# Patient Record
Sex: Male | Born: 1960 | Race: White | Hispanic: No | Marital: Married | Smoking: Former smoker
Health system: Southern US, Community
[De-identification: ages and names within clinical notes are randomized; demographics above are authoritative.]

## PROBLEM LIST (undated history)

## (undated) DIAGNOSIS — E119 Type 2 diabetes mellitus without complications: Secondary | ICD-10-CM

## (undated) DIAGNOSIS — I219 Acute myocardial infarction, unspecified: Secondary | ICD-10-CM

## (undated) DIAGNOSIS — U071 COVID-19: Secondary | ICD-10-CM

## (undated) DIAGNOSIS — I2510 Atherosclerotic heart disease of native coronary artery without angina pectoris: Secondary | ICD-10-CM

## (undated) HISTORY — DX: Atherosclerotic heart disease of native coronary artery without angina pectoris: I25.10

## (undated) HISTORY — DX: COVID-19: U07.1

## (undated) HISTORY — DX: Acute myocardial infarction, unspecified: I21.9

## (undated) HISTORY — DX: Type 2 diabetes mellitus without complications: E11.9

## (undated) HISTORY — PX: CORONARY STENT PLACEMENT: SHX1402

---

## 2010-07-09 DEATH — deceased

## 2020-09-26 ENCOUNTER — Other Ambulatory Visit: Payer: Self-pay

## 2020-09-26 ENCOUNTER — Ambulatory Visit (INDEPENDENT_AMBULATORY_CARE_PROVIDER_SITE_OTHER): Payer: 59 | Admitting: Pulmonary Disease

## 2020-09-26 ENCOUNTER — Encounter: Payer: Self-pay | Admitting: Pulmonary Disease

## 2020-09-26 VITALS — BP 128/70 | HR 59 | Ht 73.0 in | Wt 313.2 lb

## 2020-09-26 DIAGNOSIS — Z6841 Body Mass Index (BMI) 40.0 and over, adult: Secondary | ICD-10-CM

## 2020-09-26 DIAGNOSIS — R0602 Shortness of breath: Secondary | ICD-10-CM

## 2020-09-26 DIAGNOSIS — R0600 Dyspnea, unspecified: Secondary | ICD-10-CM

## 2020-09-26 DIAGNOSIS — Z8616 Personal history of COVID-19: Secondary | ICD-10-CM

## 2020-09-26 DIAGNOSIS — R0683 Snoring: Secondary | ICD-10-CM

## 2020-09-26 DIAGNOSIS — J849 Interstitial pulmonary disease, unspecified: Secondary | ICD-10-CM

## 2020-09-26 DIAGNOSIS — Z87891 Personal history of nicotine dependence: Secondary | ICD-10-CM | POA: Diagnosis not present

## 2020-09-26 DIAGNOSIS — R0609 Other forms of dyspnea: Secondary | ICD-10-CM

## 2020-09-26 MED ORDER — TRELEGY ELLIPTA 100-62.5-25 MCG/INH IN AEPB: 1.0000 | INHALATION_SPRAY | Freq: Every day | RESPIRATORY_TRACT | 0 refills | Status: AC

## 2020-09-26 NOTE — Progress Notes (Signed)
Patient seen in the office today and instructed on use of Trelegy 100.  Patient expressed understanding and demonstrated technique.  Joe Hicks [facility] 09/26/20

## 2020-09-26 NOTE — Progress Notes (Signed)
Synopsis: Referred in sept 2022 for DOE by No ref. provider found  Subjective:   PATIENT ID: Joe Hicks GENDER: male DOB: [birth year], MRN: [id number]  Chief Complaint  Patient presents with   Consult    Self referral for COPD. States he had COVID PNA back in February 2022 and has had issues with SOB ever since. PCP mentioned that he may have COPD.     PMH cad, stent placement X4, history of covid19 feb/march 2022, he was taken to [facility] for management of his [phone]. He was [place] and was sent to [facility]. Was never placed on vent support.  Patient is a former smoker, at his heaviest smoking 2 packs/day.  He is a truck driver/repair/mechanic occupation history.  Mother had pulmonary fibrosis which was felt to potentially be related to asbestos exposure.  Father died in a motor vehicle accident at age 59.   Past Medical History:  Diagnosis Date   CAD (coronary artery disease)    COVID-19    DMII (diabetes mellitus, type 2) (HCC)    MI (myocardial infarction) (HCC)      Family History  Problem Relation Age of Onset   Pulmonary fibrosis Mother      Past Surgical History:  Procedure Laterality Date   CORONARY STENT PLACEMENT      Social History   Socioeconomic History   Marital status: Married    Spouse name: Not on file   Number of children: Not on file   Years of education: Not on file   Highest education level: Not on file  Occupational History   Not on file  Tobacco Use   Smoking status: Former    Packs/day: 2.00    Types: Cigarettes    Start date: 01/08/1981    Quit date: 03/11/2000    Years since quitting: 20.5   Smokeless tobacco: Never  Substance and Sexual Activity   Alcohol use: Not on file   Drug use: Not on file   Sexual activity: Not on file  Other Topics Concern   Not on file  Social History Narrative   Not on file   Social Determinants of Health   Financial Resource Strain: Not on file  Food Insecurity: Not on file   Transportation Needs: Not on file  Physical Activity: Not on file  Stress: Not on file  Social Connections: Not on file  Intimate Partner Violence: Not on file     No Known Allergies   Outpatient Medications Prior to Visit  Medication Sig Dispense Refill   aspirin EC 81 MG tablet Take 81 mg by mouth daily.     calcium-vitamin D (OSCAL WITH D) 500-200 MG-UNIT TABS tablet Take by mouth.     metFORMIN (GLUCOPHAGE-XR) 500 MG 24 hr tablet Take 500 mg by mouth daily.     pravastatin (PRAVACHOL) 40 MG tablet SMARTSIG:1 Tablet(s) By Mouth Every Evening     traZODone (DESYREL) 50 MG tablet Take by mouth as needed.     vitamin B-12 (CYANOCOBALAMIN) 1000 MCG tablet Take 1,000 mcg by mouth daily.     No facility-administered medications prior to visit.    Review of Systems  Constitutional:  Positive for malaise/fatigue. Negative for chills, fever and weight loss.  HENT:  Negative for hearing loss, sore throat and tinnitus.   Eyes:  Negative for blurred vision and double vision.  Respiratory:  Positive for shortness of breath. Negative for cough, hemoptysis, sputum production, wheezing and stridor.   Cardiovascular:  Negative for chest pain, palpitations, orthopnea, leg swelling and PND.  Gastrointestinal:  Negative for abdominal pain, constipation, diarrhea, heartburn, nausea and vomiting.  Genitourinary:  Negative for dysuria, hematuria and urgency.  Musculoskeletal:  Negative for joint pain and myalgias.  Skin:  Negative for itching and rash.  Neurological:  Negative for dizziness, tingling, weakness and headaches.  Endo/Heme/Allergies:  Negative for environmental allergies. Does not bruise/bleed easily.  Psychiatric/Behavioral:  Negative for depression. The patient is not nervous/anxious and does not have insomnia.   All other systems reviewed and are negative.   Objective:  Physical Exam Vitals reviewed.  Constitutional:      General: He is not in acute distress.    Appearance: He  is well-developed. He is obese.  HENT:     Head: Normocephalic and atraumatic.  Eyes:     General: No scleral icterus.    Conjunctiva/sclera: Conjunctivae normal.     Pupils: Pupils are equal, round, and reactive to light.  Neck:     Vascular: No JVD.     Trachea: No tracheal deviation.  Cardiovascular:     Rate and Rhythm: Normal rate and regular rhythm.     Heart sounds: Normal heart sounds. No murmur heard. Pulmonary:     Effort: Pulmonary effort is normal. No tachypnea, accessory muscle usage or respiratory distress.     Breath sounds: No stridor. No wheezing, rhonchi or rales.  Abdominal:     General: Bowel sounds are normal. There is no distension.     Palpations: Abdomen is soft.     Tenderness: There is no abdominal tenderness.  Musculoskeletal:        General: No tenderness.     Cervical back: Neck supple.  Lymphadenopathy:     Cervical: No cervical adenopathy.  Skin:    General: Skin is warm and dry.     Capillary Refill: Capillary refill takes less than 2 seconds.     Findings: No rash.  Neurological:     Mental Status: He is alert and oriented to person, place, and time.  Psychiatric:        Behavior: Behavior normal.     Vitals:   09/26/20 1451  BP: 128/70  Pulse: (!) 59  SpO2: 96%  Weight: (!) 313 lb 3.2 oz (142.1 kg)  Height: 6\' 1"  (1.854 m)   96% on RA BMI Readings from Last 3 Encounters:  09/26/20 41.32 kg/m   Wt Readings from Last 3 Encounters:  09/26/20 (!) 313 lb 3.2 oz (142.1 kg)     CBC No results found for: WBC, RBC, HGB, HCT, PLT, MCV, MCH, MCHC, RDW, LYMPHSABS, MONOABS, EOSABS, BASOSABS    Chest Imaging: No recent CT imaging  Pulmonary Functions Testing Results: No flowsheet data found.  FeNO:   Pathology:   Echocardiogram:  Heart Catheterization:     Assessment & Plan:     ICD-10-CM   1. Interstitial pulmonary disease (HCC)  J84.9 CT CHEST HIGH RESOLUTION    Pulmonary Function Test    2. SOB (shortness of breath)   R06.02 Pulmonary Function Test    3. DOE (dyspnea on exertion)  R06.00 Pulmonary Function Test    4. Former smoker  Z87.891 Pulmonary Function Test    5. History of COVID-19  Z86.16     6. BMI 40.0-44.9, adult [facility])  Z68.41       Discussion:  This is a 60 year old gentleman, history of COVID-19 in February/March 2022.  Developed significant shortness of breath posthospitalization.  Was never  intubated or placed on mechanical life support however did have severe COVID.  He is a former smoker quit many years ago but at his max with smoking 2 packs/day.  Referred for evaluation of his dyspnea on exertion and shortness of breath.  He was discharged on home oxygen therapy and no longer needing this.  He has not had any recent axial CT imaging.  Plan: We will start by obtaining an HRCT of the chest Was help rule out any parenchymal changes related to his history of COVID. We will obtain full pulmonary function test prior to next office visit. Start him on Trelegy samples to see if this helps symptomatically. New prescription for Trelegy Can continue albuterol for shortness of breath and wheezing.  I will order home sleep study for the patient to see Dr. Val Eagle, sleep clinic follow-up.    Current Outpatient Medications:    aspirin EC 81 MG tablet, Take 81 mg by mouth daily., Disp: , Rfl:    calcium-vitamin D (OSCAL WITH D) 500-200 MG-UNIT TABS tablet, Take by mouth., Disp: , Rfl:    metFORMIN (GLUCOPHAGE-XR) 500 MG 24 hr tablet, Take 500 mg by mouth daily., Disp: , Rfl:    pravastatin (PRAVACHOL) 40 MG tablet, SMARTSIG:1 Tablet(s) By Mouth Every Evening, Disp: , Rfl:    traZODone (DESYREL) 50 MG tablet, Take by mouth as needed., Disp: , Rfl:    vitamin B-12 (CYANOCOBALAMIN) 1000 MCG tablet, Take 1,000 mcg by mouth daily., Disp: , Rfl:     Josephine Igo, DO [facility] 09/26/2020 3:35 PM

## 2020-09-26 NOTE — Addendum Note (Signed)
Addended by: Maurene Capes on: 09/26/2020 04:32 PM   Modules accepted: Orders

## 2020-09-26 NOTE — Patient Instructions (Addendum)
Thank you for visiting Dr. Tonia Brooms at [facility]. Today we recommend the following:  Full PFTs prior to next office visit.  HRCT prior to next office visit  Trelegy samples + prescription   Please order HST with Dr. Wynona Neat, follow up with him after HST complete.   Return in about 4 weeks (around 10/24/2020) for with APP or Dr. Tonia Brooms.    Please do your part to reduce the spread of COVID-19.

## 2020-10-25 ENCOUNTER — Inpatient Hospital Stay: Admission: RE | Admit: 2020-10-25 | Payer: 59 | Source: Ambulatory Visit

## 2020-10-31 ENCOUNTER — Ambulatory Visit: Payer: 59 | Admitting: Pulmonary Disease

## 2020-11-03 ENCOUNTER — Inpatient Hospital Stay: Admission: RE | Admit: 2020-11-03 | Payer: 59 | Source: Ambulatory Visit

## 2020-11-21 ENCOUNTER — Ambulatory Visit (INDEPENDENT_AMBULATORY_CARE_PROVIDER_SITE_OTHER)
Admission: RE | Admit: 2020-11-21 | Discharge: 2020-11-21 | Disposition: A | Discharge status: Home / Self Care | Payer: 59 | Source: Ambulatory Visit | Attending: Pulmonary Disease | Admitting: Pulmonary Disease

## 2020-11-21 ENCOUNTER — Other Ambulatory Visit: Payer: Self-pay

## 2020-11-21 DIAGNOSIS — J849 Interstitial pulmonary disease, unspecified: Secondary | ICD-10-CM

## 2020-11-28 ENCOUNTER — Ambulatory Visit: Payer: 59 | Admitting: Pulmonary Disease

## 2020-12-03 NOTE — Progress Notes (Signed)
The patient was not present for his last scheduled appt. He however has significant findings on his HRCT concerning for fibrosis. Please contact patient and let him know that I would like for him to follow up with Dr. Marchelle Gearing to review results and discuss next steps. If we are unable to reach please send certified letter that our office has tried to reach him.   Next available new consult ILD appt with MR would be appropriate.   Thanks,  BLI  Josephine Igo, DO [facility] 12/03/2020 6:22 PM

## 2020-12-07 ENCOUNTER — Telehealth: Payer: Self-pay | Admitting: Internal Medicine

## 2020-12-07 NOTE — Telephone Encounter (Signed)
LMTCB

## 2020-12-07 NOTE — Telephone Encounter (Signed)
Called patient but he did not answer. Left message for him to call back tomorrow.

## 2020-12-27 NOTE — Telephone Encounter (Signed)
Pt has appt scheduled with MR 01/12/21. Closing encounter.

## 2021-01-06 ENCOUNTER — Ambulatory Visit: Payer: 59 | Admitting: Pulmonary Disease

## 2021-01-12 ENCOUNTER — Ambulatory Visit (INDEPENDENT_AMBULATORY_CARE_PROVIDER_SITE_OTHER): Payer: 59 | Admitting: Internal Medicine

## 2021-01-12 ENCOUNTER — Encounter: Payer: Self-pay | Admitting: Internal Medicine

## 2021-01-12 ENCOUNTER — Other Ambulatory Visit: Payer: Self-pay

## 2021-01-12 VITALS — BP 134/86 | HR 66 | Temp 97.9°F | Ht 72.0 in | Wt 312.4 lb

## 2021-01-12 DIAGNOSIS — R0609 Other forms of dyspnea: Secondary | ICD-10-CM | POA: Diagnosis not present

## 2021-01-12 DIAGNOSIS — Z8616 Personal history of COVID-19: Secondary | ICD-10-CM | POA: Diagnosis not present

## 2021-01-12 DIAGNOSIS — R0683 Snoring: Secondary | ICD-10-CM

## 2021-01-12 DIAGNOSIS — J849 Interstitial pulmonary disease, unspecified: Secondary | ICD-10-CM | POA: Diagnosis not present

## 2021-01-12 DIAGNOSIS — Z87891 Personal history of nicotine dependence: Secondary | ICD-10-CM

## 2021-01-12 DIAGNOSIS — I2510 Atherosclerotic heart disease of native coronary artery without angina pectoris: Secondary | ICD-10-CM

## 2021-01-12 DIAGNOSIS — R911 Solitary pulmonary nodule: Secondary | ICD-10-CM

## 2021-01-12 DIAGNOSIS — Z6841 Body Mass Index (BMI) 40.0 and over, adult: Secondary | ICD-10-CM | POA: Diagnosis not present

## 2021-01-12 LAB — SEDIMENTATION RATE: Sed Rate: 34 mm/hr — ABNORMAL HIGH (ref 0–20)

## 2021-01-12 NOTE — Patient Instructions (Addendum)
ICD-10-CM   1. Interstitial pulmonary disease (HCC)  J84.9 Sedimentation rate    Angiotensin converting enzyme    ANA    Aldolase    Anti-DNA antibody, double-stranded    Rheumatoid factor    Cyclic citrul peptide antibody, IgG    CK total and CKMB (cardiac)not at [facility]    Sjogren's syndrome antibods(ssa + ssb)    Hypersensitivity Pneumonitis    Anti-Jo 1 antibody, IgG    RNP Antibodies    Anti-scleroderma antibody    Pulmonary function test    ECHOCARDIOGRAM COMPLETE    AMB referral to pulmonary rehabilitation    Anti-scleroderma antibody    RNP Antibodies    Anti-Jo 1 antibody, IgG    Hypersensitivity Pneumonitis    Sjogren's syndrome antibods(ssa + ssb)    CK total and CKMB (cardiac)not at [facility]    Cyclic citrul peptide antibody, IgG    Rheumatoid factor    Anti-DNA antibody, double-stranded    Aldolase    ANA    Angiotensin converting enzyme    Sedimentation rate    2. History of COVID-19  Z86.16     3. DOE (dyspnea on exertion)  R06.09     4. BMI 40.0-44.9, adult ([place])  Z68.41     5. Snoring  R06.83     6. Former smoker  Z87.891     10. Coronary artery disease involving native heart without angina pectoris, unspecified vessel or lesion type  I25.10     8. Nodule of upper lobe of left lung  R91.1       Plan  - Do blood work ESR, ACE, ANA, DS-DNA, RF, anti-CCP, , Total CK,  Aldolase,  scl-70, ssA, ssB, anti-RNP, anti-JO-1,  & Hypersensitivity Pneumonitis Panel - do full PFT  - do ILD questionnaire - do echo cardiogram  - refer pulmonary rehab (but get clearance from yoru cardiologist it is safe)  - refer sleep doc in our office  Followup  - next few weeks to few months to review results  = will need repeeat CT chest for nodule in May 2023

## 2021-01-12 NOTE — Progress Notes (Signed)
Chief Complaint  Patient presents with   Consult      Self referral for COPD. States he had COVID PNA back in February 2022 and has had issues with SOB ever since. PCP mentioned that he may have COPD.       PMH cad, stent placement X4, history of covid19 feb/march 2022, he was taken to [facility] for management of his [phone]. He was [place] and was sent to [facility]. Was never placed on vent support.  Patient is a former smoker, at his heaviest smoking 2 packs/day.  He is a truck driver/repair/mechanic occupation history.  Mother had pulmonary fibrosis which was felt to potentially be related to asbestos exposure.  Father died in a motor vehicle accident at age 23    OV 01/12/2021 referral by Dr. June Leap to the ILD center -  Dr. Chase Caller  Subjective:  Patient ID: Joe Hicks, male , DOB: 14-Jul-1960 , age 61 y.o. , MRN: [id number] , ADDRESS: [place] [address] PCP Driggers, Hewitt Shorts, PA-C Patient Care Team: Driggers, Sharen Hint as PCP - General (Physician Assistant)  This Provider for this visit: Treatment Team:  Attending Provider: Brand Males, MD    01/12/2021 -   Chief Complaint  Patient presents with   Follow-up    Pt had HRCT performed and is here due to that. Pt states that he does become SOB with ambulation and if does various activities.     HPI Joe Hicks 61 y.o. -presents with his wife.  History is given by him and review of the chart.  He has morbid obesity weighing 321 pounds in the past.  He says prior to him getting COVID-19 in February 2022 he had lost weight to 275 pounds but then after getting COVID he has now gained weight to 312 pounds.  The COVID itself was severe.  He says he was hospitalized for up to 2 weeks between [place] and [facility].  He was on high flow oxygen.  They were looking at intubating him but he refused.  He believes he survived because he refused intubation.  He is  currently off oxygen.  He saw Dr. June Leap.  After this a CT scan of the chest was done November 2022.  Was a high-resolution CT chest that I personally visualized.  Radiologist feels description fits most with post-COVID setting with more upper lobe predominance.  The dyspnea is definitely present only after the COVID.  After the COVID is also gained his old weight back or at least almost.  No chest pains.  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  So has not had a sleep evaluation.  Wife does note that he has had apneic spells some years ago but does note heavily even currently.    SYMPTOM SCALE - ILD 01/12/2021  Current weight   O2 use ra  Shortness of Breath 0 -> 5 scale with 5 being worst (score 6 If unable to do)  At rest 0  Simple tasks - showers, clothes change, eating, shaving 0  Household (dishes, doing bed, laundry) 0  Shopping 3  Walking level at own pace 4  Walking  up Stairs 5  Total (30-36) Dyspnea Score 12  How bad is your cough? 2  How bad is your fatigue 0  How bad is nausea 0  How bad is vomiting?  0  How bad is diarrhea? 0  How bad is anxiety? 3  How bad is depression 0  Any chronic pain - if so where and how bad 0   Simple office walk 185 feet x  3 laps goal with forehead probe 01/12/2021    O2 used 3   Number laps completed avg   Comments about pace ra   Resting Pulse Ox/HR 100% and 66/min   Final Pulse Ox/HR 97% and 84/min   Desaturated </= 88% no   Desaturated <= 3% points yes   Got Tachycardic >/= 90/min no   Symptoms at end of test Mild-mod dyspnea   Miscellaneous comments x       CT Chest data HRCT 11/21/20  IMPRESSION: 1. Fibrotic interstitial lung disease which is most pronounced in the mid and upper  lungs. Favor post COVID fibrosis. Findings are suggestive of an alternative diagnosis (not UIP) per consensus guidelines: Diagnosis of Idiopathic Pulmonary Fibrosis: An Official ATS/ERS/JRS/ALAT Clinical Practice Guideline. [place], Iss 5, [phone], Sep 08 2016. 2. Solid pulmonary nodul*6*6 7 [facility] s (from today's scan) is considered optional for low-risk patients, but is recommended for high-risk patients. This recommendation follows the consensus statement: Guidelines for Management of Incidental Pulmonary Nodules Detected on CT Images: From the Fleischner Society 2017; Radiology 2017; 284:228-243. 3. Aortic Atherosclerosis (ICD10-I70.0). 4. Three-vessel coronary artery calcifications, recommend ASCVD risk assessment.     Electronically Signed   By: Yetta Glassman M.D.   On: 11/21/2020 12:41    No results found.    PFT  No flowsheet data found.     has a past medical history of CAD (coronary artery disease), COVID-19, DMII (diabetes mellitus, type 2) ([place]), and MI (myocardial infarction) ([place]).   reports that he quit smoking about 20 years ago. His smoking use included cigarettes. He started smoking about 40 years ago. He smoked an average of 2 packs per day. He has never used smokeless tobacco.  Past Surgical History:  Procedure Laterality Date   CORONARY STENT PLACEMENT      No Known Allergies  Immunization History  Administered Date(s) Administered   Influenza,inj,Quad PF,6+ Mos 03/05/2020   Pneumococcal Polysaccharide-23 03/05/2020    Family History  Problem Relation Age of Onset   Pulmonary fibrosis Mother      Current Outpatient Medications:    aspirin EC 81 MG tablet, Take 81 mg by mouth daily., Disp: , Rfl:    calcium-vitamin D (OSCAL WITH D) 500-200 MG-UNIT TABS tablet, Take by mouth., Disp: , Rfl:    Fluticasone-Umeclidin-Vilant (TRELEGY ELLIPTA) 100-62.5-25 MCG/INH AEPB, Inhale 1 puff into the lungs daily., Disp: 1 each,  Rfl: 0   metFORMIN (GLUCOPHAGE-XR) 500 MG 24 hr tablet, Take 500 mg by mouth daily., Disp: , Rfl:    pravastatin (PRAVACHOL) 40 MG tablet, SMARTSIG:1 Tablet(s) By Mouth Every Evening, Disp: , Rfl:    traZODone (DESYREL) 50 MG tablet, Take by mouth as needed., Disp: , Rfl:    vitamin B-12 (CYANOCOBALAMIN) 1000 MCG tablet, Take 1,000 mcg by mouth daily., Disp: , Rfl:       Objective:   Vitals:   01/12/21 0814  BP: 134/86  Pulse: 66  Temp: 97.9 F (36.6 C)  TempSrc: Oral  SpO2: 100%  Weight: Marland Kitchen)  312 lb 6.4 oz (141.7 kg)  Height: 6' (1.829 m)    Estimated body mass index is 42.37 kg/m as calculated from the following:   Height as of this encounter: 6' (1.829 m).   Weight as of this encounter: 312 lb 6.4 oz (141.7 kg).  @WEIGHTCHANGE @  [employer]   01/12/21 0814  Weight: (!) 312 lb 6.4 oz (141.7 kg)     Physical Exam  General: No distress. obese Neuro: Alert and Oriented x 3. GCS 15. Speech normal Psych: Pleasant Resp:  Barrel Chest - no.  Wheeze - no, Crackles - no, No overt respiratory distress CVS: Normal heart sounds. Murmurs - no Ext: Stigmata of Connective Tissue Disease - no HEENT: Normal upper airway. PEERL +. No post nasal drip. MALLAMPATTI CLAS 3        Assessment:       ICD-10-CM   1. Interstitial pulmonary disease ([place])  J84.9     2. History of COVID-19  Z86.16     3. DOE (dyspnea on exertion)  R06.09     4. BMI 40.0-44.9, adult ([place])  Z68.41     5. Snoring  R06.83     6. Former smoker  Z87.891     10. Coronary artery disease involving native heart without angina pectoris, unspecified vessel or lesion type  I25.10          Plan:     Patient Instructions     ICD-10-CM   1. Interstitial pulmonary disease ([place])  J84.9     2. History of COVID-19  Z86.16     3. DOE (dyspnea on exertion)  R06.09     4. BMI 40.0-44.9, adult ([place])  Z68.41     5. Snoring  R06.83     6. Former smoker  Z87.891     29. Coronary artery disease involving  native heart without angina pectoris, unspecified vessel or lesion type  I25.10       Plan  - Do blood work ESR, ACE, ANA, DS-DNA, RF, anti-CCP, , Total CK,  Aldolase,  scl-70, ssA, ssB, anti-RNP, anti-JO-1,  & Hypersensitivity Pneumonitis Panel - do full PFT  - do ILD questionnaire - do echo cardiogram  - refer pulmonary rehab (but get clearance from yoru cardiologist it is safe)  - refer sleep doc in our office  Followup  - next few weeks to few months to review results   ( Level 05 visit: Estb 40-54 min   visit type: on-site physical face to visit  in total care time and counseling or/and coordination of care by this undersigned MD - Dr Brand Males. This includes one or more of the following on this same day 01/12/2021: pre-charting, chart review, note writing, documentation discussion of test results, diagnostic or treatment recommendations, prognosis, risks and benefits of management options, instructions, education, compliance or risk-factor reduction. It excludes time spent by the [place] or office staff in the care of the patient. Actual time 50 min)    SIGNATURE    Dr. Brand Males, M.D., F.C.C.P,  Pulmonary and Critical Care Medicine Staff Physician, [place] Director - Interstitial Lung Disease  Program  Pulmonary [place] at [place], [place], [address]  Pager: [phone], If no answer or between  15:00h - 7:00h: call [phone] Telephone: [phone]  9:11 AM 01/12/2021

## 2021-01-16 LAB — HYPERSENSITIVITY PNEUMONITIS
A. Pullulans Abs: NEGATIVE
A.Fumigatus #1 Abs: NEGATIVE
Micropolyspora faeni, IgG: NEGATIVE
Pigeon Serum Abs: NEGATIVE
Thermoact. Saccharii: NEGATIVE
Thermoactinomyces vulgaris, IgG: NEGATIVE

## 2021-01-16 LAB — ALDOLASE: Aldolase: 5 U/L (ref <=8.1)

## 2021-01-16 LAB — ANTI-JO 1 ANTIBODY, IGG: Anti JO-1: <0.2 AI (ref 0.0–0.9)

## 2021-01-16 LAB — RNP ANTIBODIES: ENA RNP Ab: <0.2 AI (ref 0.0–0.9)

## 2021-01-17 ENCOUNTER — Telehealth (HOSPITAL_COMMUNITY): Payer: Self-pay | Admitting: *Deleted

## 2021-01-17 ENCOUNTER — Telehealth (HOSPITAL_COMMUNITY): Payer: Self-pay | Admitting: Physician Assistant

## 2021-01-17 LAB — SJOGREN'S SYNDROME ANTIBODS(SSA + SSB)
SSA (Ro) (ENA) Antibody, IgG: <1 AI
SSB (La) (ENA) Antibody, IgG: <1 AI

## 2021-01-17 LAB — ANA: Anti Nuclear Antibody (ANA): NEGATIVE

## 2021-01-17 LAB — ANGIOTENSIN CONVERTING ENZYME: Angiotensin-Converting Enzyme: 41 U/L (ref 9–67)

## 2021-01-17 LAB — CK TOTAL AND CKMB (NOT AT ARMC): Total CK: 93 U/L (ref 44–196)

## 2021-01-17 LAB — CYCLIC CITRUL PEPTIDE ANTIBODY, IGG: Cyclic Citrullin Peptide Ab: <16 UNITS

## 2021-01-17 LAB — ANTI-DNA ANTIBODY, DOUBLE-STRANDED: ds DNA Ab: 1 IU/mL

## 2021-01-17 LAB — RHEUMATOID FACTOR: Rheumatoid fact SerPl-aCnc: <14 IU/mL (ref <14)

## 2021-01-17 LAB — ANTI-SCLERODERMA ANTIBODY: Scleroderma (Scl-70) (ENA) Antibody, IgG: <1 AI

## 2021-01-17 NOTE — Telephone Encounter (Signed)
Returned call from message left earlier by pt wife  - Joe Hicks who is listed as DPR.  Pt received a text message regarding scheduling for pulmonary rehab. At first he thought it was about his heart as Dr. Marchelle Gearing mentioned he would like to get an echocardiogram. Explained  who we were and that the text messages are generated by a third party vendor and do not accurately indicate ready for scheduling.  Advised we ar reviewing the referral clinically and for insurance coverage.  Once completed he will be added to the wait list and he will be contacted to review benefits and scheduling at later time. Noted that pt lives in [place] which according to the pt is about 50 miles from [place].  Unfortunately programs closer to his home are not covered by his insurance therefore he would like to come to [facility]. Reviewed upcoming appt with PFT on 2/23 and follow up by [facility]. Thanked me for the information and for the return call. Alanson Aly, BSN Cardiac and Emergency planning/management officer

## 2021-01-23 ENCOUNTER — Encounter (HOSPITAL_COMMUNITY): Payer: Self-pay | Admitting: *Deleted

## 2021-01-23 NOTE — Progress Notes (Signed)
Received referral from Dr. Marchelle Gearing for this pt to participate in pulmonary rehab with the diagnosis of Interstitial Pulmonary Disease. Clinical review of pt follow up appt on 01/12/21 Pulmonary office note.  Pt with Covid Risk Score - 3. Pt appropriate for scheduling for Pulmonary rehab.  Will forward to support staff for scheduling when able as there is a wait list and verification of insurance eligibility/benefits with pt consent. Alanson Aly, BSN Cardiac and Emergency planning/management officer

## 2021-02-15 ENCOUNTER — Other Ambulatory Visit (HOSPITAL_COMMUNITY): Payer: 59

## 2021-02-20 ENCOUNTER — Ambulatory Visit (HOSPITAL_COMMUNITY): Payer: 59 | Attending: Cardiology

## 2021-02-20 ENCOUNTER — Other Ambulatory Visit: Payer: Self-pay

## 2021-02-20 DIAGNOSIS — J849 Interstitial pulmonary disease, unspecified: Secondary | ICD-10-CM | POA: Diagnosis not present

## 2021-02-20 DIAGNOSIS — R0602 Shortness of breath: Secondary | ICD-10-CM

## 2021-02-20 LAB — ECHOCARDIOGRAM COMPLETE
Area-P 1/2: 2.29 cm2
S' Lateral: 4 cm

## 2021-02-20 MED ORDER — PERFLUTREN LIPID MICROSPHERE
3.0000 mL | INTRAVENOUS | Status: AC | PRN
  Administered 2021-02-20: 3 mL via INTRAVENOUS

## 2021-03-02 ENCOUNTER — Ambulatory Visit (INDEPENDENT_AMBULATORY_CARE_PROVIDER_SITE_OTHER): Payer: 59 | Admitting: Internal Medicine

## 2021-03-02 ENCOUNTER — Ambulatory Visit: Payer: 59

## 2021-03-02 ENCOUNTER — Other Ambulatory Visit: Payer: Self-pay

## 2021-03-02 ENCOUNTER — Encounter: Payer: Self-pay | Admitting: Internal Medicine

## 2021-03-02 VITALS — BP 126/70 | HR 60 | Temp 97.8°F | Ht 73.0 in | Wt 312.6 lb

## 2021-03-02 DIAGNOSIS — J849 Interstitial pulmonary disease, unspecified: Secondary | ICD-10-CM | POA: Diagnosis not present

## 2021-03-02 DIAGNOSIS — R0609 Other forms of dyspnea: Secondary | ICD-10-CM | POA: Diagnosis not present

## 2021-03-02 DIAGNOSIS — I5189 Other ill-defined heart diseases: Secondary | ICD-10-CM | POA: Diagnosis not present

## 2021-03-02 DIAGNOSIS — Z6841 Body Mass Index (BMI) 40.0 and over, adult: Secondary | ICD-10-CM

## 2021-03-02 DIAGNOSIS — Z87891 Personal history of nicotine dependence: Secondary | ICD-10-CM

## 2021-03-02 DIAGNOSIS — R911 Solitary pulmonary nodule: Secondary | ICD-10-CM

## 2021-03-02 DIAGNOSIS — Z8616 Personal history of COVID-19: Secondary | ICD-10-CM | POA: Diagnosis not present

## 2021-03-02 LAB — PULMONARY FUNCTION TEST
DL/VA % pred: 112 %
DL/VA: 4.69 ml/min/mmHg/L
DLCO cor % pred: 82 %
DLCO cor: 25.09 ml/min/mmHg
DLCO unc % pred: 82 %
DLCO unc: 25.09 ml/min/mmHg
FEF 25-75 Post: 6.09 L/sec
FEF 25-75 Pre: 4.92 L/sec
FEF2575-%Change-Post: 23 %
FEF2575-%Pred-Post: 186 %
FEF2575-%Pred-Pre: 150 %
FEV1-%Change-Post: 4 %
FEV1-%Pred-Post: 92 %
FEV1-%Pred-Pre: 88 %
FEV1-Post: 3.68 L
FEV1-Pre: 3.53 L
FEV1FVC-%Change-Post: 3 %
FEV1FVC-%Pred-Pre: 113 %
FEV6-%Change-Post: 1 %
FEV6-%Pred-Post: 82 %
FEV6-%Pred-Pre: 80 %
FEV6-Post: 4.15 L
FEV6-Pre: 4.09 L
FEV6FVC-%Change-Post: 0 %
FEV6FVC-%Pred-Post: 104 %
FEV6FVC-%Pred-Pre: 103 %
FVC-%Change-Post: 1 %
FVC-%Pred-Post: 78 %
FVC-%Pred-Pre: 77 %
FVC-Post: 4.16 L
FVC-Pre: 4.11 L
Post FEV1/FVC ratio: 89 %
Post FEV6/FVC ratio: 100 %
Pre FEV1/FVC ratio: 86 %
Pre FEV6/FVC Ratio: 99 %
RV % pred: 72 %
RV: 1.74 L
TLC % pred: 77 %
TLC: 5.91 L

## 2021-03-02 NOTE — Progress Notes (Signed)
Chief Complaint  Patient presents with   Consult      Self referral for COPD. States he had COVID PNA back in February 2022 and has had issues with SOB ever since. PCP mentioned that he may have COPD.       PMH cad, stent placement X4, history of covid19 feb/march 2022, he was taken to [place] for management of his [phone]. He was taken from [place] and was sent to [facility]. Was never placed on vent support.  Patient is a former smoker, at his heaviest smoking 2 packs/day.  He is a truck driver/repair/mechanic occupation history.  Mother had pulmonary fibrosis which was felt to potentially be related to asbestos exposure.  Father died in a motor vehicle accident at age 98    OV 01/12/2021 referral by Dr. June Leap to the ILD center -  Dr. Chase Caller  Subjective:  Patient ID: Joe Hicks, male , DOB: April 27, 1960 , age 61 y.o. , MRN: [id number] , ADDRESS: [place] [id number] PCP Driggers, Hewitt Shorts, PA-C Patient Care Team: Driggers, Sharen Hint as PCP - General (Physician Assistant)  This Provider for this visit: Treatment Team:  Attending Provider: Brand Males, MD    01/12/2021 -   Chief Complaint  Patient presents with   Follow-up    Pt had HRCT performed and is here due to that. Pt states that he does become SOB with ambulation and if does various activities.     HPI Joe Hicks 61 y.o. -presents with his wife.  History is given by him and review of the chart.  He has morbid obesity weighing 321 pounds in the past.  He says prior to him getting COVID-19 in February 2022 he had lost weight to 275 pounds but then after getting COVID he has now gained weight to 312 pounds.  The COVID itself was severe.  He says he was hospitalized for up to 2 weeks between [place] and [facility].  He was on high flow oxygen.  They were looking at intubating him but he refused.  He believes he survived because he refused intubation.  He is  currently off oxygen.  He saw Dr. June Leap.  After this a CT scan of the chest was done November 2022.  Was a high-resolution CT chest that I personally visualized.  Radiologist feels description fits most with post-COVID setting with more upper lobe predominance.  The dyspnea is definitely present only after the COVID.  After the COVID is also gained his old weight back or at least almost.  No chest pains.  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  So has not had a sleep evaluation.  Wife does note that he has had apneic spells some years ago but does note heavily even currently.       CT Chest data HRCT 11/21/20 FINDINGS: Cardiovascular: Normal heart size. No pericardial effusion. Three-vessel coronary artery calcifications. Mild atherosclerotic disease of the thoracic aorta.   Mediastinum/Nodes: Small hiatal hernia. Normal thyroid. No pathologically enlarged lymph nodes seen in the chest.   Lungs/Pleura: Central airways are patent. No significant air trapping. Peripheral predominant reticular and ground-glass opacities, with areas  of subpleural and peribronchovascular involvement, most pronounced in the mid to upper lungs. Traction bronchiolectasis. No evidence of honeycomb change. Solid pulmonary nodule of the right upper lobe measuring 6 mm on series 5, image 141.   Upper Abdomen: No acute abnormality.   Musculoskeletal: No chest wall mass or suspicious bone lesions identified.   IMPRESSION: 1. 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 Favor post COVID fibrosis. Findings are suggestive of an alternative diagnosis (not UIP) per consensus guidelines: Diagnosis of Idiopathic Pulmonary  Fibrosis: An Official ATS/ERS/JRS/ALAT Clinical Practice Guideline. [place], Iss 5, [phone], Sep 08 2016. 2. Solid pulmonary nodul*6*6 7 UNC s (from today's scan) is considered optional for low-risk patients, but is recommended for high-risk patients. This recommendation follows the consensus statement: Guidelines for Management of Incidental Pulmonary Nodules Detected on CT Images: From the Fleischner Society 2017; Radiology 2017; 284:228-243. 3. Aortic Atherosclerosis (ICD10-I70.0). 4. Three-vessel coronary artery calcifications, recommend ASCVD risk assessment.     Electronically Signed   By: Yetta Glassman M.D.   On: 11/21/2020 12:41    No results found.       OV 03/02/2021  Subjective:  Patient ID: Joe Hicks, male , DOB: 02/07/1960 , age 61 y.o. , MRN: [id number] , ADDRESS: [place] [id number] PCP Driggers, Hewitt Shorts, PA-C Patient Care Team: Driggers, Sharen Hint as PCP - General (Physician Assistant)  This Provider for this visit: Treatment Team:  Attending Provider: Brand Males, MD    03/02/2021 -   Chief Complaint  Patient presents with   Follow-up    PFT performed today. Pt is here to discuss results of recent testing.  Pt states he has been doing okay and denies any real complaints.   Shortness of breath developed after severe COVID associated with morbid obesity  6 mm right upper lobe lung nodule/former smoker #2022  ILD changes of alternate diagnosis consistent with COVID and high-res CT November 2022  HPI Joe Hicks 61 y.o. -returns for follow-up.  Presents with his wife.  He had serology and hypersensitive pneumonitis panel and this is negative.  He did not bring the ILD questionnaire.  He is doing well overall.  His pulmonary function test is essentially normal.  His TLC and FVC are 77% but then he is got morbid obesity.  His DLCO is normal.  His echocardiogram shows grade 1 diastolic  dysfunction.  He is yet to see the sleep physician.     SYMPTOM SCALE - ILD 01/12/2021  Current weight   O2 use ra  Shortness of Breath 0 -> 5 scale with 5 being worst (score 6 If unable to do)  At rest 0  Simple tasks - showers, clothes change, eating, shaving 0  Household (dishes, doing bed, laundry) 0  Shopping 3  Walking level at own pace 4  Walking up Stairs 5  Total (30-36) Dyspnea Score 12  How bad is your cough? 2  How bad is your fatigue 0  How bad is nausea 0  How bad is vomiting?  0  How bad is diarrhea? 0  How bad is anxiety? 3  How bad is depression 0  Any chronic pain - if so where and how bad 0   Simple office walk 185 feet x  3 laps goal with forehead probe 01/12/2021    O2 used 3   Number laps completed avg   Comments about pace ra  Resting Pulse Ox/HR 100% and 66/min   Final Pulse Ox/HR 97% and 84/min   Desaturated </= 88% no   Desaturated <= 3% points yes   Got Tachycardic >/= 90/min no   Symptoms at end of test Mild-mod dyspnea   Miscellaneous comments x     PFT  PFT Results Latest Ref Rng & Units 03/02/2021  FVC-Pre L 4.11  FVC-Predicted Pre % 77  FVC-Post L 4.16  FVC-Predicted Post % 78  Pre FEV1/FVC % % 86  Post FEV1/FCV % % 89  FEV1-Pre L 3.53  FEV1-Predicted Pre % 88  FEV1-Post L 3.68  DLCO uncorrected ml/min/mmHg 25.09  DLCO UNC% % 82  DLCO corrected ml/min/mmHg 25.09  DLCO COR %Predicted % 82  DLVA Predicted % 112  TLC L 5.91  TLC % Predicted % 77  RV % Predicted % 72    SEROLOGY   Latest Reference Range & Units 01/12/21 09:23  CK Total 44 - 196 U/L 93  CK, MB  CANCELED  Aldolase < OR = 8.1 U/L 5.0  Sed Rate 0 - 20 mm/hr 34 (H)  Anti Nuclear Antibody (ANA) NEGATIVE  NEGATIVE  Angiotensin-Converting Enzyme 9 - 67 U/L 41  Anti JO-1 0.0 - 0.9 AI [id number]  Cyclic Citrullin Peptide Ab UNITS <16  ds DNA Ab IU/mL 1  ENA RNP Ab 0.0 - 0.9 AI <0.2  RA Latex Turbid. <14 IU/mL <14  SSA (Ro) (ENA) Antibody, IgG <1.0 NEG AI <1.0 NEG   SSB (La) (ENA) Antibody, IgG <1.0 NEG AI <1.0 NEG  Scleroderma (Scl-70) (ENA) Antibody, IgG <1.0 NEG AI <1.0 NEG  A.Fumigatus #1 Abs Negative  Negative  Micropolyspora faeni, IgG Negative  Negative  Thermoactinomyces vulgaris, IgG Negative  Negative  A. Pullulans Abs Negative  Negative  Thermoact. Saccharii Negative  Negative  P[place]on Serum Abs Negative  Negative  (H): Data is abnormally high  ECHO Feb 2023   Sonographer Comments: Technically difficult study due to poor echo windows  and patient is morbidly obese. Image acquisition challenging due to  patient body habitus.  IMPRESSIONS     1. Left ventricular ejection fraction, by estimation, is 50 to 55%. The  left ventricle has low normal function. The left ventricle has no regional  wall motion abnormalities. There is mild left ventricular hypertrophy.  Left ventricular diastolic  parameters are consistent with Grade I diastolic dysfunction (impaired  relaxation).   2. Right ventricular systolic function is normal. The right ventricular  size is normal.   3. The mitral valve is normal in structure. No evidence of mitral valve  regurgitation. No evidence of mitral stenosis.   4. The aortic valve is normal in structure. Aortic valve regurgitation is  not visualized. No aortic stenosis is present.   5. The inferior vena cava is normal in size with greater than 50%  respiratory variability, suggesting right atrial pressure of 3 mmHg.     has a past medical history of CAD (coronary artery disease), COVID-19, DMII (diabetes mellitus, type 2) ([place]), and MI (myocardial infarction) ([place]).   reports that he quit smoking about 20 years ago. His smoking use included cigarettes. He started smoking about 40 years ago. He smoked an average of 2 packs per day. He has never used smokeless tobacco.  Past Surgical History:  Procedure Laterality Date   CORONARY STENT PLACEMENT      No Known Allergies  Immunization History  Administered  Date(s) Administered   Influenza,inj,Quad PF,6+ Mos 03/05/2020   Pneumococcal Polysaccharide-23  03/05/2020    Family History  Problem Relation Age of Onset   Pulmonary fibrosis Mother      Current Outpatient Medications:    aspirin EC 81 MG tablet, Take 81 mg by mouth daily., Disp: , Rfl:    calcium-vitamin D (OSCAL WITH D) 500-200 MG-UNIT TABS tablet, Take by mouth., Disp: , Rfl:    metFORMIN (GLUCOPHAGE-XR) 500 MG 24 hr tablet, Take 500 mg by mouth daily., Disp: , Rfl:    pravastatin (PRAVACHOL) 40 MG tablet, SMARTSIG:1 Tablet(s) By Mouth Every Evening, Disp: , Rfl:    traZODone (DESYREL) 50 MG tablet, Take by mouth as needed., Disp: , Rfl:    vitamin B-12 (CYANOCOBALAMIN) 1000 MCG tablet, Take 1,000 mcg by mouth daily., Disp: , Rfl:    Fluticasone-Umeclidin-Vilant (TRELEGY ELLIPTA) 100-62.5-25 MCG/INH AEPB, Inhale 1 puff into the lungs daily. (Patient not taking: Reported on 03/02/2021), Disp: 1 each, Rfl: 0      Objective:   Vitals:   03/02/21 1555  BP: 126/70  Pulse: 60  Temp: 97.8 F (36.6 C)  TempSrc: Oral  SpO2: 97%  Weight: (!) 312 Joe 9.6 oz (141.8 kg)  Height: 6\' 1"  (1.854 m)    Estimated body mass index is 41.24 kg/m as calculated from the following:   Height as of this encounter: 6\' 1"  (1.854 m).   Weight as of this encounter: 312 Joe 9.6 oz (141.8 kg).  @WEIGHTCHANGE @  [employer]   03/02/21 1555  Weight: (!) 312 Joe 9.6 oz (141.8 kg)     Physical Exam  Obese male sitting comfortably in talking.  Alert and oriented x3.     Assessment:       ICD-10-CM   1. DOE (dyspnea on exertion)  R06.09     2. Interstitial pulmonary disease ([place])  J84.9     3. History of COVID-19  Z86.16     4. Grade I diastolic dysfunction  123XX123     5. BMI 40.0-44.9, adult ([place])  Z68.41     6. Nodule of upper lobe of left lung  R91.1 CT Chest Wo Contrast    7. Former smoker  Z87.891          Plan:     Patient Instructions  DOE (dyspnea on exertion) BMI  40.0-44.9, adult ([place]) Grade 1 diastolic dysfunction  -This is mainly due to weight and mildly stiff heart muscle.  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  Seen on CT scan November 2022 History of COVID-19 Negative serology early 2023   -Based on CT scan results and pulmonary function test result the burden of this is extremely mild.  Overall most likely benign course expected  Plan - Clinically monitor   Nodule of upper lobe of left lung -6 mm right upper lobe November 2022 Former smoker  Plan  - Do CT scan of the chest as follow-up May 2023  Snoring and high risk for sleep apnea  Plan  - Refer to sleep doctor in our office   Followup  - Refer to sleep doctor in our office - Video visit to discuss CT scan of the chest results in May 2023  (Level 04: Estb 30-39 min visit type: on-site physical face to visit visit spent in total care time and counseling or/and coordination of care by this undersigned MD - Dr Brand Males. This includes one or more of the following on this same day 03/02/2021: pre-charting, chart review, note writing, documentation  discussion of test results, diagnostic or treatment recommendations, prognosis, risks and benefits of management options, instructions, education, compliance or risk-factor reduction. It excludes time spent by the [place] or office staff in the care of the patient . Actual time is 30 min)   SIGNATURE    Dr. Brand Males, M.D., F.C.C.P,  Pulmonary and Critical Care Medicine Staff Physician, [place] Director - Interstitial Lung Disease  Program  Pulmonary [place] [facility] [place], [place], [address]  Pager: [phone], If no answer or between  15:00h - 7:00h: call [phone] Telephone: [phone]  5:10 PM 03/02/2021

## 2021-03-02 NOTE — Patient Instructions (Addendum)
DOE (dyspnea on exertion) BMI 40.0-44.9, adult ([facility]) Grade 1 diastolic dysfunction  -This is mainly due to weight and mildly stiff heart muscle.  Plan  - Weight loss and physical fitness  Interstitial pulmonary disease ([facility]) -post COVID.  Seen on CT scan November 2022 History of COVID-19 Negative serology early 2023   -Based on CT scan results and pulmonary function test result the burden of this is extremely mild.  Overall most likely benign course expected  Plan - Clinically monitor   Nodule of upper lobe of left lung -6 mm right upper lobe November 2022 Former smoker  Plan  - Do CT scan of the chest as follow-up May 2023  Snoring and high risk for sleep apnea  Plan  - Refer to sleep doctor in our office   Followup  - Refer to sleep doctor in our office - Video visit to discuss CT scan of the chest results in May 2023

## 2021-03-07 ENCOUNTER — Telehealth (HOSPITAL_COMMUNITY): Payer: Self-pay

## 2021-03-07 NOTE — Telephone Encounter (Signed)
Spoke with pt's wife and she stated that would like to go to pulmonary rehab in [place] because its closer. Will fax pt's referral/demographic info to [facility] cardiopulmonary rehab. Fax: [phone]

## 2021-04-03 ENCOUNTER — Institutional Professional Consult (permissible substitution): Payer: 59 | Admitting: Pulmonary Disease

## 2021-04-04 ENCOUNTER — Ambulatory Visit: Payer: 59 | Admitting: Pulmonary Disease

## 2021-04-04 ENCOUNTER — Encounter: Payer: Self-pay | Admitting: Pulmonary Disease

## 2021-04-04 ENCOUNTER — Other Ambulatory Visit: Payer: Self-pay

## 2021-04-04 DIAGNOSIS — G4733 Obstructive sleep apnea (adult) (pediatric): Secondary | ICD-10-CM

## 2021-04-04 NOTE — Assessment & Plan Note (Signed)
Given excessive daytime somnolence, narrow pharyngeal exam, witnessed apneas & loud snoring, obstructive sleep apnea is very likely & an overnight polysomnogram will be scheduled as a home study. The pathophysiology of obstructive sleep apnea , it's cardiovascular consequences & modes of treatment including CPAP were discused with the patient in detail & they evidenced understanding. ? ?He would be unable to sleep in any setting outside of his home so I doubt that he would tolerate a study in the sleep center.  If it does turn out that he has OSA, CPAP will need to be initiated at home with auto settings.  We discussed alternative therapies but he does not seem to be a candidate for hypoglossal nerve implant due to his obesity ? ?

## 2021-04-04 NOTE — Progress Notes (Signed)
? ?Subjective:  ? ? Patient ID: Joe Hicks, male    DOB: 1960/05/22, 61 y.o.   MRN: [id number] ? ?HPI ? ?61 year old remote smoker with ILD presents for evaluation of sleep disordered breathing. ?Referring physician -Dr. Marchelle Gearing ? 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 ?Accompanied by wife Joe Hicks who corroborates sleep history.  He has snored loudly for many years but over the past few years she reports choking and gasping episodes in his sleep.  He always wakes up feeling tired regardless of how much she sleeps. ?For the past 6 to 8 years he reports difficulty maintaining his sleep.  He has tried ZzzQuil, melatonin over-the-counter and is now on prescription trazodone.  Epworth sleepiness score is 6 and he reports some sleepiness while sitting and reading or watching TV. ?Bedtime is between 10 and 11 PM, he sleeps in any position with 1 pillow, sleep latency can be about an hour but he has several nocturnal awakenings and after 3 AM he will often get into his recliner and sleep until 7 AM when he wakes up feeling tired with dryness of mouth. ?He takes an occasional nap for 15 to 30 minutes but still feels tired and this interferes with his night sleep. ?There is no history suggestive of cataplexy, sleep paralysis or parasomnias ? ?His weight has fluctuated to a lowest of 273 pounds and highest of 312 ? ? ? ?Significant tests/ events reviewed ? ?HRCT chest 11/2020 favor post covid fibrosis , RUL nodule 6 mm ? ?PFTs  02/2021 normal ? ? ?Past Medical History:  ?Diagnosis Date  ? CAD (coronary artery disease)   ? COVID-19   ? DMII (diabetes mellitus, type 2) ([facility])   ? MI (myocardial infarction) ([facility])   ? ? ?Past Surgical History:  ?Procedure Laterality Date  ? CORONARY STENT PLACEMENT    ? ? ?No Known Allergies ? ?Social History   ? ?Socioeconomic History  ? Marital status: Married  ?  Spouse name: Not on file  ? Number of children: Not on file  ? Years of education: Not on file  ? Highest education level: Not on file  ?Occupational History  ? Not on file  ?Tobacco Use  ? Smoking status: Former  ?  Packs/day: 2.00  ?  Types: Cigarettes  ?  Start date: 01/08/1981  ?  Quit date: 03/11/2000  ?  Years since quitting: 21.0  ? Smokeless tobacco: Never  ?Substance and Sexual Activity  ? Alcohol use: Not on file  ? Drug use: Not on file  ? Sexual activity: Not on file  ?Other Topics Concern  ? Not on file  ?Social History Narrative  ? Not on file  ? ?Social Determinants of Health  ? ?Financial Resource Strain: Not on file  ?Food Insecurity: Not on file  ?Transportation Needs: Not on file  ?Physical Activity: Not on file  ?Stress: Not on file  ?Social Connections: Not on file  ?Intimate Partner Violence: Not on file  ? ? ? ?Family History  ?Problem Relation Age of Onset  ? Pulmonary fibrosis Mother   ? ? ? ? ?Review of Systems ?Constitutional: negative for anorexia, fevers and sweats  ?Eyes: negative for irritation, redness and visual disturbance  ?Ears, nose, mouth, throat, and face: negative for earaches, epistaxis, nasal congestion and sore throat  ?  Respiratory: negative for cough, dyspnea on exertion, sputum and wheezing  ?Cardiovascular: negative for chest pain, dyspnea, lower extremity edema, orthopnea, palpitations and syncope  ?Gastrointestinal: negative for abdominal pain, constipation, diarrhea, melena, nausea and vomiting  ?Genitourinary:negative for dysuria, frequency and hematuria  ?Hematologic/lymphatic: negative for bleeding, easy bruising and lymphadenopathy  ?Musculoskeletal:negative for arthralgias, muscle weakness and stiff joints  ?Neurological: negative for coordination problems, gait problems, headaches and weakness  ?Endocrine: negative for diabetic symptoms including polydipsia, polyuria and weight loss ? ?   ?Objective:  ?  Physical Exam ? ?Gen. Pleasant, obese, in no distress, normal affect ?ENT - no pallor,icterus, no post nasal drip, class 2-3 airway, squint ?Neck: No JVD, no thyromegaly, no carotid bruits ?Lungs: no use of accessory muscles, no dullness to percussion, decreased without rales or rhonchi  ?Cardiovascular: Rhythm regular, heart sounds  normal, no murmurs or gallops, no peripheral edema ?Abdomen: soft and non-tender, no hepatosplenomegaly, BS normal. ?Musculoskeletal: No deformities, no cyanosis or clubbing ?Neuro:  alert, non focal, no tremors ? ? ? ?   ?Assessment & Plan:  ? ?Morbid obesity -close correlation between OSA and weight was discussed.  I have given a short-term weight loss goal of 30 pounds with diet and exercise regimen.  Can refer him to health and wellness center but they live a long distance away ? ?

## 2021-04-04 NOTE — Patient Instructions (Signed)
X home sleep test °

## 2021-05-29 ENCOUNTER — Other Ambulatory Visit: Payer: 59

## 2021-05-31 ENCOUNTER — Ambulatory Visit: Payer: 59 | Admitting: Internal Medicine

## 2021-07-25 ENCOUNTER — Inpatient Hospital Stay: Admission: RE | Admit: 2021-07-25 | Payer: 59 | Source: Ambulatory Visit

## 2021-08-22 ENCOUNTER — Ambulatory Visit: Payer: 59 | Admitting: Internal Medicine

## 2022-09-24 IMAGING — CT CT CHEST HIGH RESOLUTION
2 of 8 series · 14 of 36 positions shown, 17 images · non-contrast
Comparison: None.

CLINICAL DATA: Evaluate interstitial lung disease

EXAM:
CT CHEST WITHOUT CONTRAST
TECHNIQUE: Multidetector CT imaging of the chest was performed following the
standard protocol without intravenous contrast. High resolution
imaging of the lungs, as well as inspiratory and expiratory imaging,
was performed.

[Series 5: high resolution · axial · 0.80mm/px · z∈[-266,+1]mm · 11 of 321 slices shown, 14 images]
[im 27/321  mediastinal]
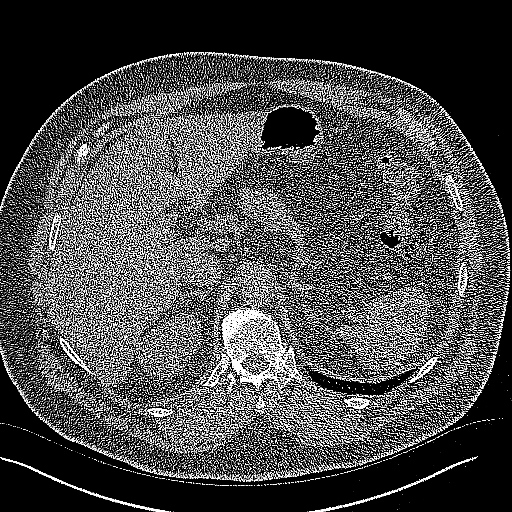
[im 27/321  lung]
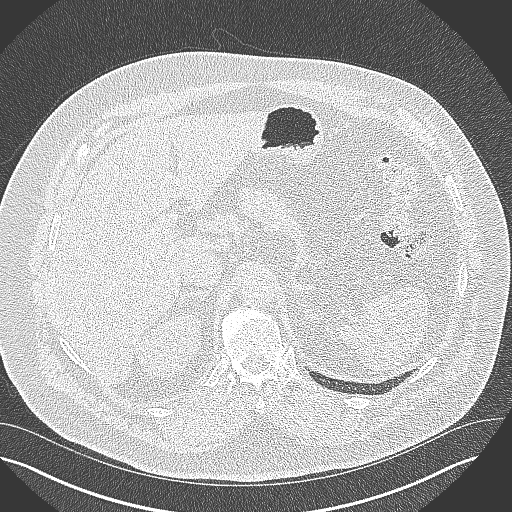
[im 54/321  lung]
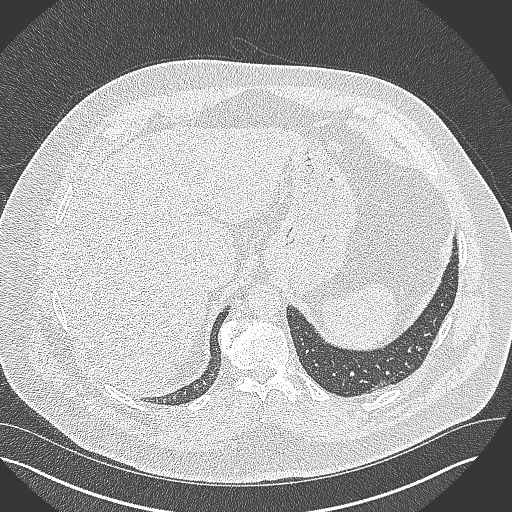
[im 81/321  lung]
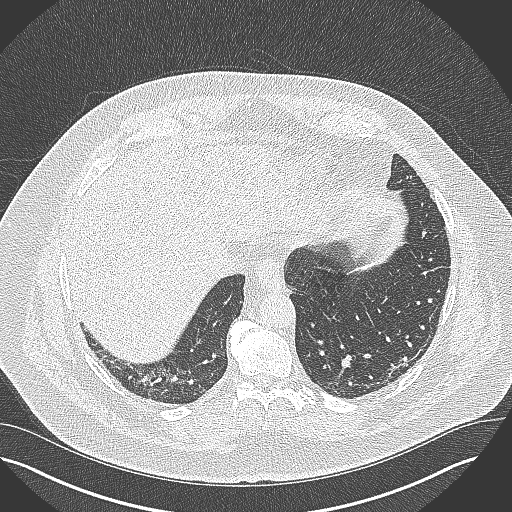
[im 107/321  lung]
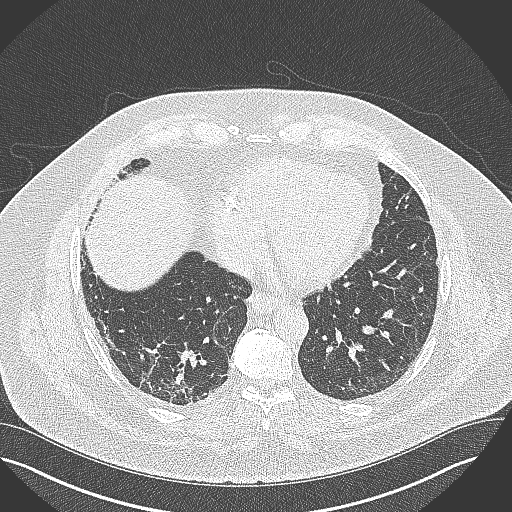
[im 134/321  mediastinal]
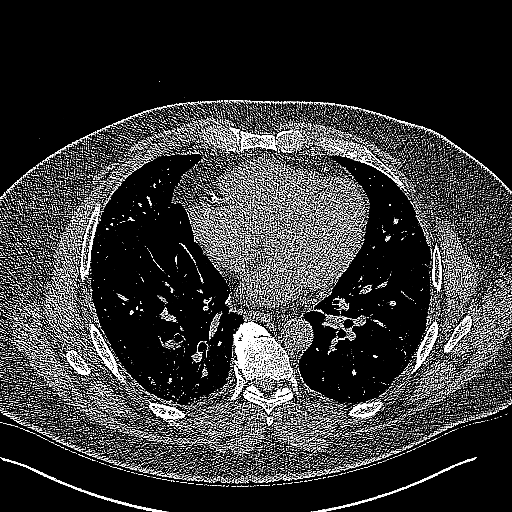
[im 134/321  lung]
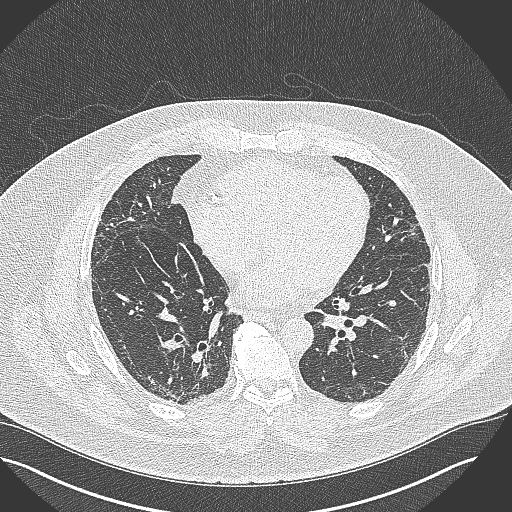
[im 161/321  lung]
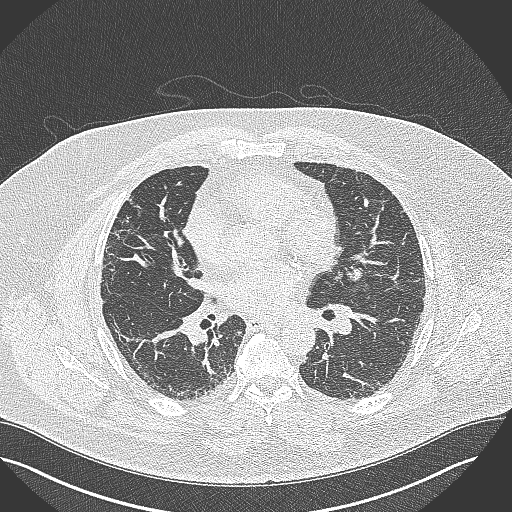
[im 187/321  lung]
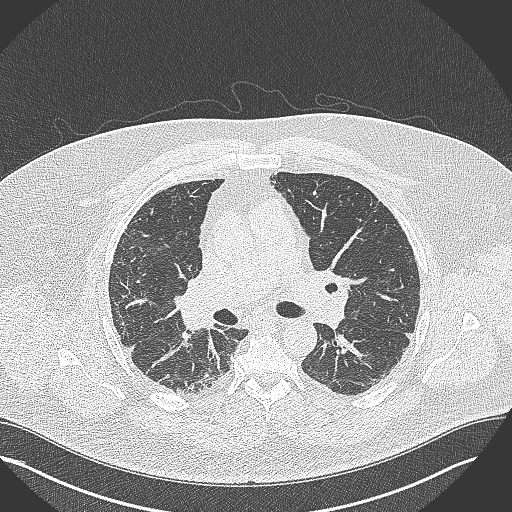
[im 214/321  lung]
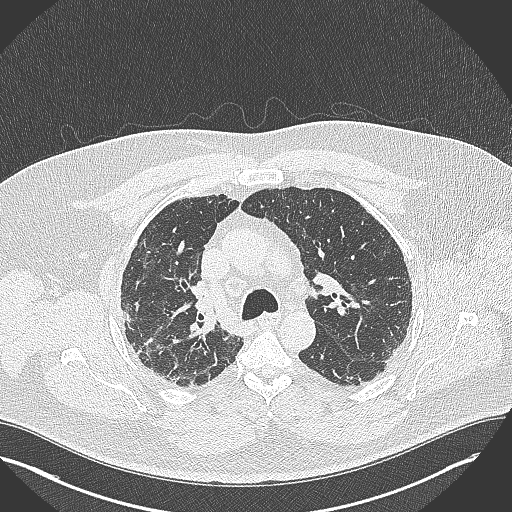
[im 241/321  mediastinal]
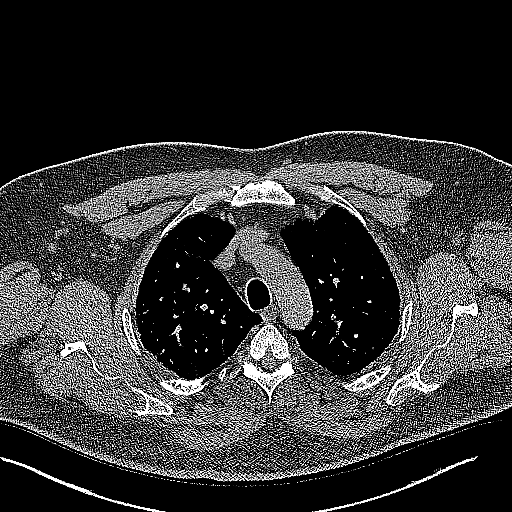
[im 241/321  lung]
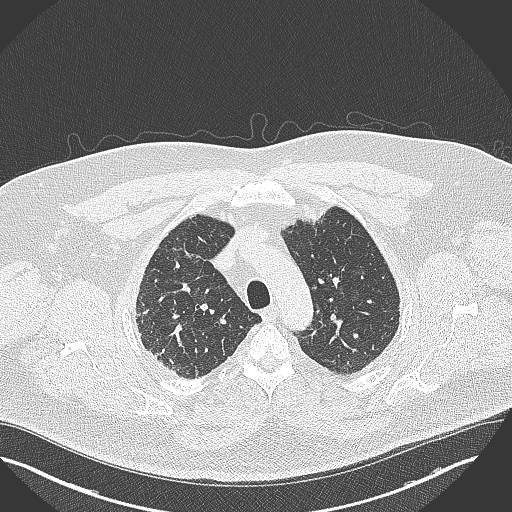
[im 267/321  lung]
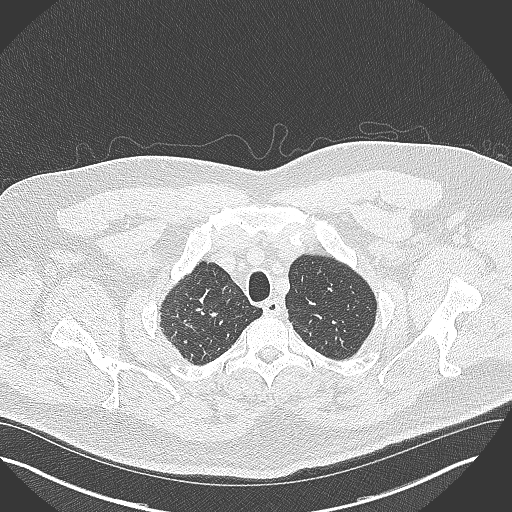
[im 294/321  lung]
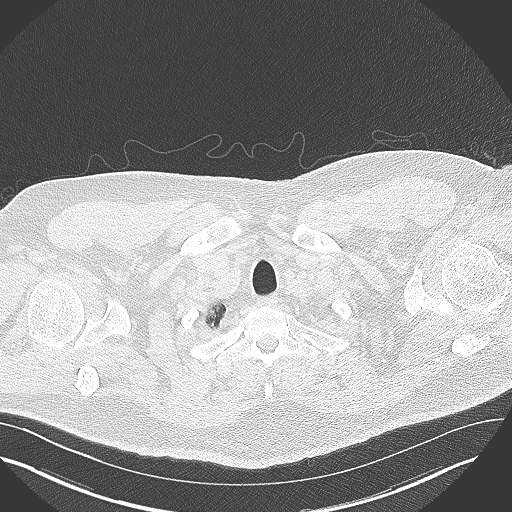

[Series 8: coronal · coronal · 0.65mm/px · 3 of 134 slices shown]
[im 27/134  lung]
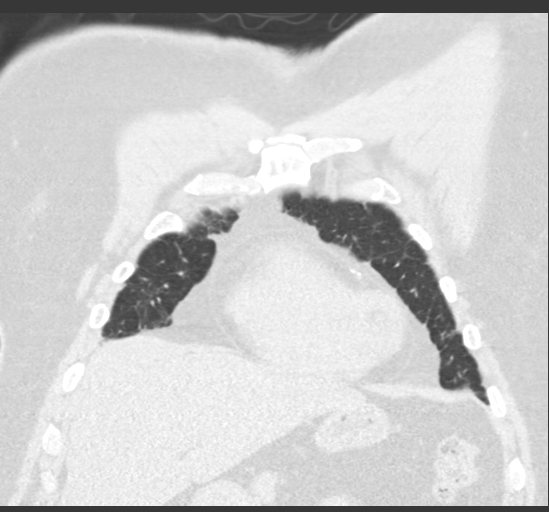
[im 54/134  lung]
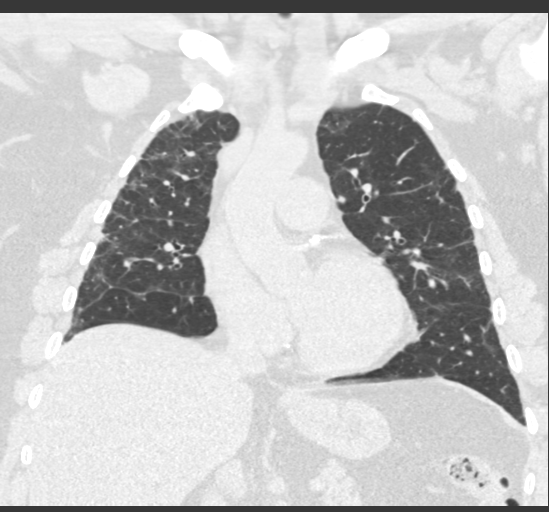
[im 80/134  lung]
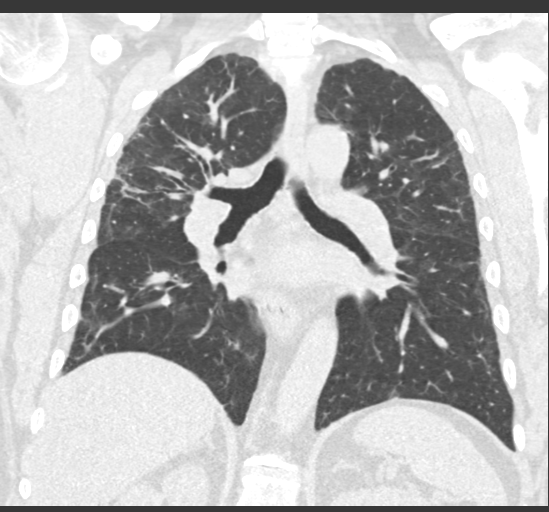

[14 of 36 positions shown; findings below may reference images not displayed]

FINDINGS: Cardiovascular: Normal heart size. No pericardial effusion.
Three-vessel coronary artery calcifications. Mild atherosclerotic
disease of the thoracic aorta.

Mediastinum/Nodes: Small hiatal hernia. Normal thyroid. No
pathologically enlarged lymph nodes seen in the chest.

Lungs/Pleura: Central airways are patent. No significant air
trapping. Peripheral predominant reticular and ground-glass
opacities, with areas of subpleural and peribronchovascular
involvement, most pronounced in the mid to upper lungs. Traction
bronchiolectasis. No evidence of honeycomb change. Solid pulmonary
nodule of the right upper lobe measuring 6 mm on series 5, image
141.

Upper Abdomen: No acute abnormality.

Musculoskeletal: No chest wall mass or suspicious bone lesions
identified.
IMPRESSION: 1. Fibrotic interstitial lung disease which is most pronounced in
the mid and upper lungs. Favor post COVID fibrosis. Findings are
suggestive of an alternative diagnosis (not UIP) per consensus
guidelines: Diagnosis of Idiopathic Pulmonary Fibrosis: An Official
ATS/ERS/JRS/ALAT Clinical Practice Guideline. Am J Respir Crit Care
Med Vol 198, Awa 5, [id number][DATE].
2. Solid pulmonary nodule of the left upper lobe measuring 6 mm.
Non-contrast chest CT at 6-12 months is recommended. If the nodule
is stable at time of repeat CT, then future CT at 18-24 months (from
today's scan) is considered optional for low-risk patients, but is
recommended for high-risk patients. This recommendation follows the
consensus statement: Guidelines for Management of Incidental
Pulmonary Nodules Detected on CT Images: From the [HOSPITAL]
3. Aortic Atherosclerosis ([id number]-L73.3).
4. Three-vessel coronary artery calcifications, recommend ASCVD risk
assessment.
